# Patient Record
Sex: Female | Born: 1975 | Race: Black or African American | Hispanic: No | Marital: Married | Smoking: Current every day smoker
Health system: Southern US, Community
[De-identification: ages and names within clinical notes are randomized; demographics above are authoritative.]

## PROBLEM LIST (undated history)

## (undated) DIAGNOSIS — R12 Heartburn: Secondary | ICD-10-CM

## (undated) DIAGNOSIS — K219 Gastro-esophageal reflux disease without esophagitis: Secondary | ICD-10-CM

## (undated) DIAGNOSIS — I10 Essential (primary) hypertension: Secondary | ICD-10-CM

## (undated) HISTORY — DX: Heartburn: R12

## (undated) HISTORY — DX: Essential (primary) hypertension: I10

## (undated) HISTORY — DX: Gastro-esophageal reflux disease without esophagitis: K21.9

---

## 2003-01-16 HISTORY — PX: TUBAL LIGATION: SHX77

## 2010-06-23 DIAGNOSIS — R12 Heartburn: Secondary | ICD-10-CM

## 2010-06-23 DIAGNOSIS — I10 Essential (primary) hypertension: Secondary | ICD-10-CM

## 2010-06-23 HISTORY — DX: Heartburn: R12

## 2010-06-23 HISTORY — DX: Essential (primary) hypertension: I10

## 2010-08-02 ENCOUNTER — Encounter (INDEPENDENT_AMBULATORY_CARE_PROVIDER_SITE_OTHER): Payer: Self-pay

## 2010-09-28 ENCOUNTER — Ambulatory Visit (INDEPENDENT_AMBULATORY_CARE_PROVIDER_SITE_OTHER): Payer: Self-pay | Admitting: Internal Medicine

## 2015-11-21 ENCOUNTER — Encounter: Payer: Self-pay | Admitting: Gastroenterology

## 2015-12-14 ENCOUNTER — Telehealth: Payer: Self-pay | Admitting: Gastroenterology

## 2015-12-14 ENCOUNTER — Ambulatory Visit: Payer: Self-pay | Admitting: Gastroenterology

## 2015-12-14 ENCOUNTER — Encounter: Payer: Self-pay | Admitting: Gastroenterology

## 2015-12-14 NOTE — Telephone Encounter (Signed)
PT WAS A NO SHOW AND LETTER SENT  °

## 2018-03-26 ENCOUNTER — Encounter: Payer: Self-pay | Admitting: Gastroenterology

## 2018-06-02 ENCOUNTER — Encounter: Payer: Self-pay | Admitting: Gastroenterology

## 2018-06-02 ENCOUNTER — Telehealth: Payer: Self-pay | Admitting: Gastroenterology

## 2018-06-02 ENCOUNTER — Telehealth: Payer: Self-pay

## 2018-06-02 ENCOUNTER — Ambulatory Visit: Payer: Self-pay | Admitting: Nurse Practitioner

## 2018-06-02 ENCOUNTER — Other Ambulatory Visit: Payer: Self-pay

## 2018-06-02 NOTE — Telephone Encounter (Signed)
Tried to call pt at 9:55am [phone]) to start visit with EG, no answer, LMOVM for return call. Also tried to call [phone], fast busy signal. No return call as of 10:15am.  Misty Stanley, please no show pt.

## 2018-06-02 NOTE — Telephone Encounter (Signed)
Noted  

## 2018-06-02 NOTE — Telephone Encounter (Signed)
PATIENT WAS A NO SHOW/NO ANSWER AND LETTER SENT  °

## 2018-06-02 NOTE — Progress Notes (Signed)
Patient was a no-show 

## 2018-06-03 NOTE — Telephone Encounter (Signed)
Patient no showed and letter sent  °

## 2019-12-30 ENCOUNTER — Ambulatory Visit: Payer: Managed Care, Other (non HMO) | Admitting: Endocrinology

## 2020-02-12 ENCOUNTER — Other Ambulatory Visit: Payer: Self-pay

## 2020-02-15 ENCOUNTER — Encounter: Payer: Self-pay | Admitting: Endocrinology

## 2020-02-15 ENCOUNTER — Other Ambulatory Visit: Payer: Self-pay

## 2020-02-15 ENCOUNTER — Ambulatory Visit (INDEPENDENT_AMBULATORY_CARE_PROVIDER_SITE_OTHER): Payer: Managed Care, Other (non HMO) | Admitting: Endocrinology

## 2020-02-15 DIAGNOSIS — E0590 Thyrotoxicosis, unspecified without thyrotoxic crisis or storm: Secondary | ICD-10-CM

## 2020-02-15 LAB — TSH: TSH: <0.01 u[IU]/mL — ABNORMAL LOW (ref 0.35–4.50)

## 2020-02-15 MED ORDER — METHIMAZOLE 10 MG PO TABS: 10.0000 mg | ORAL_TABLET | Freq: Two times a day (BID) | ORAL | 11 refills | Status: DC

## 2020-02-15 NOTE — Progress Notes (Signed)
Subjective:    Patient ID: Kari Huber, female    DOB: 06-19-1975, 44 y.o.   MRN: [id number]  HPI Pt is referred by K. Holt, [place], for hyperthyroidism.  Pt reports he was dx'ed with hyperthyroidism in 2019.  she has never been on therapy for this. She started taking tapazole in late 2021.  she has never had XRT to the anterior neck, or thyroid surgery.  she has never had thyroid imaging.  she does not consume kelp or any other non-prescribed thyroid medication.  she has never been on amiodarone.  Since on Tapazole, pt states she feels no different, and well in general.  Main symptom is swelling at the ant neck.   Past Medical History:  Diagnosis Date   GERD (gastroesophageal reflux disease)    Heartburn 06/23/2010   Hypertension 06/23/2010     Social History   Socioeconomic History   Marital status: Married    Spouse name: Not on file   Number of children: Not on file   Years of education: Not on file   Highest education level: Not on file  Occupational History   Not on file  Tobacco Use   Smoking status: Not on file   Smokeless tobacco: Not on file  Substance and Sexual Activity   Alcohol use: Not on file   Drug use: Not on file   Sexual activity: Not on file  Other Topics Concern   Not on file  Social History Narrative   Not on file   Social Determinants of Health   Financial Resource Strain: Not on file  Food Insecurity: Not on file  Transportation Needs: Not on file  Physical Activity: Not on file  Stress: Not on file  Social Connections: Not on file  Intimate Partner Violence: Not on file    Current Outpatient Medications on File Prior to Visit  Medication Sig Dispense Refill   hydrochlorothiazide (,MICROZIDE/HYDRODIURIL,) 12.5 MG capsule Take 12.5 mg by mouth daily.       pantoprazole (PROTONIX) 20 MG tablet Take 20 mg by mouth daily.       No current facility-administered medications on file prior to visit.    Not on  File  Family History  Problem Relation Age of Onset   Thyroid disease Neg Hx     BP 140/90 (BP Location: Right Arm, Patient Position: Sitting, Cuff Size: Large)    Pulse 75    Ht 5\' 6"  (1.676 m)    Wt 236 lb 12.8 oz (107.4 kg)    SpO2 96%    BMI 38.22 kg/m   Review of Systems denies weight loss, palpitations, sob, excessive tremor, anxiety, and fever.  She has heat intolerance.     Objective:   Physical Exam VS: see vs page GEN: no distress HEAD: head: no deformity eyes: no periorbital swelling, no proptosis external nose and ears are normal NECK: thyroid is 5 x normal size--diffuse. CHEST WALL: no deformity LUNGS: clear to auscultation CV: reg rate and rhythm, no murmur.  MUSCULOSKELETAL: gait is normal and steady EXTEMITIES: no deformity.  no leg edema NEURO:  readily moves all 4's.  sensation is intact to touch on all 4's.  No tremor SKIN:  Normal texture and temperature.  No rash or suspicious lesion is visible.  Not diaphoretic.   NODES:  None palpable at the neck.   PSYCH: alert, well-oriented.  Does not appear anxious nor depressed.    outside test results are reviewed: TSH is undetectable  Lab Results  Component Value Date   TSH <0.01 Repeated and verified X2. (L) 02/15/2020    I have reviewed outside records, and summarized: Pt was noted to have hyperthyroidism, and referred here.  Pt presented with itching.  She was already on tapazole.      Assessment & Plan:  Hyperthyroidism, new to me, uncontrolled.  reduce methimazole to 10 mg per day.    Patient Instructions  Blood tests are requested for you today.  We'll let you know about the results.  If ever you have fever while taking methimazole, stop it and call us, even if the reason is obvious, because of the risk of a rare side-effect. It is best to never miss the medication.  However, if you do miss it, next best is to double up the next time.  let's check a thyroid "scan" (a special, but easy and painless  type of thyroid x ray).  It works like this: you go to the x-ray department of the hospital to swallow a pill, which contains a miniscule amount of radiation.  You will not notice any symptoms from this.  You will go back to the x-ray department the next day, to lie down in front of a camera.  The results of this will be sent to me.   Based on the results, i hope to order for you a treatment pill of radioactive iodine.  Although it is a larger amount of radiation, you will again notice no symptoms from this.  The pill is gone from your body in a few days (during which you should stay away from other people), but takes several months to work.  Therefore, please return here approximately 6-8 weeks after the treatment.  This treatment has been available for many years, and the only known side-effect is an underactive thyroid.  It is possible that i would eventually prescribe for you a thyroid hormone pill, which is very inexpensive.  You don't have to worry about side-effects of this thyroid hormone pill, because it is the same molecule your thyroid makes.  Please consider this, and let us know.

## 2020-02-15 NOTE — Patient Instructions (Addendum)
Blood tests are requested for you today.  We'll let you know about the results.  If ever you have fever while taking methimazole, stop it and call us, even if the reason is obvious, because of the risk of a rare side-effect. It is best to never miss the medication.  However, if you do miss it, next best is to double up the next time.  let's check a thyroid "scan" (a special, but easy and painless type of thyroid x ray).  It works like this: you go to the x-ray department of the hospital to swallow a pill, which contains a miniscule amount of radiation.  You will not notice any symptoms from this.  You will go back to the x-ray department the next day, to lie down in front of a camera.  The results of this will be sent to me.   Based on the results, i hope to order for you a treatment pill of radioactive iodine.  Although it is a larger amount of radiation, you will again notice no symptoms from this.  The pill is gone from your body in a few days (during which you should stay away from other people), but takes several months to work.  Therefore, please return here approximately 6-8 weeks after the treatment.  This treatment has been available for many years, and the only known side-effect is an underactive thyroid.  It is possible that i would eventually prescribe for you a thyroid hormone pill, which is very inexpensive.  You don't have to worry about side-effects of this thyroid hormone pill, because it is the same molecule your thyroid makes.  Please consider this, and let us know.

## 2020-02-16 LAB — T4, FREE: Free T4: 0.5 ng/dL — ABNORMAL LOW (ref 0.60–1.60)

## 2020-02-17 MED ORDER — METHIMAZOLE 10 MG PO TABS: 10.0000 mg | ORAL_TABLET | Freq: Every day | ORAL | 1 refills | Status: DC

## 2020-02-18 ENCOUNTER — Other Ambulatory Visit: Payer: Self-pay | Admitting: *Deleted

## 2020-02-18 MED ORDER — METHIMAZOLE 10 MG PO TABS: 10.0000 mg | ORAL_TABLET | Freq: Every day | ORAL | 1 refills | Status: DC

## 2021-03-23 LAB — TSH: TSH: 0.01 — AB (ref 0.41–5.90)

## 2021-05-05 ENCOUNTER — Ambulatory Visit (INDEPENDENT_AMBULATORY_CARE_PROVIDER_SITE_OTHER): Payer: No Typology Code available for payment source | Admitting: Nurse Practitioner

## 2021-05-05 ENCOUNTER — Encounter: Payer: Self-pay | Admitting: Nurse Practitioner

## 2021-05-05 VITALS — BP 141/88 | HR 67 | Ht 66.0 in | Wt 238.0 lb

## 2021-05-05 DIAGNOSIS — E0590 Thyrotoxicosis, unspecified without thyrotoxic crisis or storm: Secondary | ICD-10-CM | POA: Diagnosis not present

## 2021-05-05 NOTE — Progress Notes (Signed)
? ? ? 05/05/2021   ? ? ?Endocrinology Consult Note  ? ? ?Subjective:  ? ? Patient ID: Everette Rank, female    DOB: [birth year], PCP Royann Shivers, PA-C. ? ? ?Past Medical History:  ?Diagnosis Date  ? GERD (gastroesophageal reflux disease)   ? Heartburn 06/23/2010  ? Hypertension 06/23/2010  ? ? ?Past Surgical History:  ?Procedure Laterality Date  ? TUBAL LIGATION  2005  ? ? ?Social History  ? ?Socioeconomic History  ? Marital status: Married  ?  Spouse name: Not on file  ? Number of children: Not on file  ? Years of education: Not on file  ? Highest education level: Not on file  ?Occupational History  ? Not on file  ?Tobacco Use  ? Smoking status: Not on file  ? Smokeless tobacco: Not on file  ?Substance and Sexual Activity  ? Alcohol use: Not on file  ? Drug use: Not on file  ? Sexual activity: Not on file  ?Other Topics Concern  ? Not on file  ?Social History Narrative  ? Not on file  ? ?Social Determinants of Health  ? ?Financial Resource Strain: Not on file  ?Food Insecurity: Not on file  ?Transportation Needs: Not on file  ?Physical Activity: Not on file  ?Stress: Not on file  ?Social Connections: Not on file  ? ? ?Family History  ?Problem Relation Age of Onset  ? Thyroid disease Neg Hx   ? ? ?Outpatient Encounter Medications as of 05/05/2021  ?Medication Sig  ? amLODipine (NORVASC) 5 MG tablet Take 5 mg by mouth daily.  ? [DISCONTINUED] hydrochlorothiazide (,MICROZIDE/HYDRODIURIL,) 12.5 MG capsule Take 12.5 mg by mouth daily.    ? [DISCONTINUED] methimazole (TAPAZOLE) 10 MG tablet Take 1 tablet (10 mg total) by mouth daily.  ? [DISCONTINUED] pantoprazole (PROTONIX) 20 MG tablet Take 20 mg by mouth daily.    ? ?No facility-administered encounter medications on file as of 05/05/2021.  ? ? ?ALLERGIES: ?Allergies  ?Allergen Reactions  ? Meperidine Swelling  ?  Made her head feel like it was on fire ?Made her head feel like it was on fire ?  ? ? ?VACCINATION STATUS: ? ?There is no immunization history  on file for this patient. ? ? ?HPI ? ?Mistee Soliman is 46 y.o. female who presents today with a medical history as above. she is being seen in consultation for hyperthyroidism requested by Royann Shivers, PA-C.  she has been dealing with symptoms of  right neck mass, fatigue, heat intolerance, and headache intermittently for approximately 2 years. These symptoms are progressively worsening and troubling to her.  her most recent thyroid labs revealed suppressed tSH of < 0.005 on 03/23/21.  She has been on Methimazole in the past and followed with [facility] but was stopped just over a year ago.  The has never had any imaging of her thyroid in the past. ? ?she denies dysphagia, choking, shortness of breath, no recent voice change.  ?  ?she denies family history of thyroid dysfunction- says mom had thyroidectomy due to esophageal cancer, and denies family hx of thyroid cancer. she denies personal history of goiter. she is not on any anti-thyroid medications nor on any thyroid hormone supplements. Denies use of Biotin containing supplements.  she is willing to proceed with appropriate work up and therapy for thyrotoxicosis. ? ?She does smoke marijuana which can mask some of the symptoms of hyperthyroidism. ? ? ?Review of systems ? ?Constitutional: + Minimally fluctuating body weight, current  Body mass index is 38.41 kg/m?., + fatigue, + subjective hyperthermia, no subjective hypothermia ?Eyes: no blurry vision, no xerophthalmia ?ENT: no sore throat, + nodules palpated in throat- right side, no dysphagia/odynophagia, no hoarseness ?Cardiovascular: no chest pain, no shortness of breath, no palpitations, no leg swelling ?Respiratory: no cough, no shortness of breath ?Gastrointestinal: no nausea/vomiting/diarrhea ?Musculoskeletal: no muscle/joint aches ?Skin: no rashes, no hyperemia ?Neurological: no tremors, no numbness, no tingling, no dizziness ?Psychiatric: no depression, no anxiety ? ? ?Objective:  ?   ?BP (!) 141/88   Pulse 67   Ht 5\' 6"  (1.676 m)   Wt 238 lb (108 kg)   BMI 38.41 kg/m?   ?Wt Readings from Last 3 Encounters:  ?05/05/21 238 lb (108 kg)  ?02/15/20 236 lb 12.8 oz (107.4 kg)  ?08/02/10 219 lb (99.3 kg)  ?  ? ?BP Readings from Last 3 Encounters:  ?05/05/21 (!) 141/88  ?02/15/20 140/90  ?08/02/10 122/86  ? ? ?                     ? ?Physical Exam- Limited ? ?Constitutional:  Body mass index is 38.41 kg/m?. , not in acute distress, normal state of mind ?Eyes:  EOMI, no exophthalmos ?Neck: palpable, soft baseball sized mass to right neck (? Salivary gland) ?Thyroid: No gross goiter ?Cardiovascular: RRR, no murmurs, rubs, or gallops, no edema ?Respiratory: Adequate breathing efforts, no crackles, rales, rhonchi, or wheezing ?Musculoskeletal: no gross deformities, strength intact in all four extremities, no gross restriction of joint movements ?Skin:  no rashes, no hyperemia ?Neurological: no tremor with outstretched hands ? ? ?CMP  ?No results found for: NA, K, CL, CO2, GLUCOSE, BUN, CREATININE, CALCIUM, PROT, ALBUMIN, AST, ALT, ALKPHOS, BILITOT, GFRNONAA, GFRAA ? ? ?CBC ?No results found for: WBC, RBC, HGB, HCT, PLT, MCV, MCH, MCHC, RDW, LYMPHSABS, MONOABS, EOSABS, BASOSABS ? ? ?Diabetic Labs (most recent): ?No results found for: HGBA1C ? ?Lipid Panel  ?No results found for: CHOL, TRIG, HDL, CHOLHDL, VLDL, LDLCALC, LDLDIRECT, LABVLDL ? ? ?Lab Results  ?Component Value Date  ? TSH 0.01 (A) 03/23/2021  ? TSH <0.01 Repeated and verified X2. (L) 02/15/2020  ? FREET4 0.50 (L) 02/15/2020  ?  ? ? ? ? ?Assessment & Plan:  ? ?1. Hyperthyroidism ? ?she is being seen at a kind request of 02/17/2020. ? ?her history and most recent labs are reviewed, and she was examined clinically. Subjective and objective findings are consistent with thyrotoxicosis likely from primary hyperthyroidism. The potential risks of untreated thyrotoxicosis and the need for definitive therapy have been discussed in  detail with her, and she agrees to proceed with diagnostic workup and treatment plan. ?  ?I will repeat full profile thyroid function tests today including antibody testing to help classify her dysfunction and confirmatory thyroid uptake and scan will be scheduled to be done as soon as possible.  Will also order thyroid ultrasound to assess mass to right neck. ?  ?Options of therapy are discussed with her.  We discussed the option of treating it with medications including methimazole or PTU which may have side effects including rash, transaminitis, and bone marrow suppression.  We also discussed the option of definitive therapy with RAI ablation of the thyroid. If she is found to have primary hyperthyroidism from Graves' disease , toxic multinodular goiter or toxic nodular goiter the preferred modality of treatment would be I-131 thyroid ablation. Surgery is another choice of treatment in some cases, in her case  surgery is not a good fit for presentation with only mild goiter. ? ?-Patient is made aware of the high likelihood of post ablative hypothyroidism with subsequent need for lifelong thyroid hormone replacement. sheunderstands this outcome and she is  willing to proceed.     ? ?she will return in 3 weeks for treatment decision. ? ?I did not initiate any new prescriptions at today's visit. ? ? ? ?-Patient is advised to maintain close follow up with Royann ShiversSkillman, Katherine E, PA-C for primary care needs. ? ? ?- Time spent with the patient: 60 minutes, of which >50% was spent in obtaining information about her symptoms, reviewing her previous labs, evaluations, and treatments, counseling her about her hyperthyroidism , and developing a plan to confirm the diagnosis and long term treatment as necessary. Please refer to "Patient Self Inventory" in the Media tab for reviewed elements of pertinent patient history. ? ?Caradean Kulick participated in the discussions, expressed understanding, and voiced agreement with the  above plans.  All questions were answered to her satisfaction. she is encouraged to contact clinic should she have any questions or concerns prior to her return visit.  ? ?Follow up plan: ?Return in about 3 w

## 2021-05-05 NOTE — Patient Instructions (Signed)
Hyperthyroidism  Hyperthyroidism is when the thyroid gland is too active (overactive). The thyroid gland is a small gland located in the lower front part of the neck, just in front of the windpipe (trachea). This gland makes hormones that help control how the body uses food for energy (metabolism) as well as how the heart and brain function. These hormones also play a role in keeping your bones strong. When the thyroid is overactive, it produces too much of a hormone called thyroxine. What are the causes? This condition may be caused by: Graves' disease. This is a disorder in which the body's disease-fighting system (immune system) attacks the thyroid gland. This is the most common cause. Inflammation of the thyroid gland. A tumor in the thyroid gland. Use of certain medicines, including: Prescription thyroid hormone replacement. Herbal supplements that mimic thyroid hormones. Amiodarone therapy. Solid or fluid-filled lumps within your thyroid gland (thyroid nodules). Taking in a large amount of iodine from foods or medicines. What increases the risk? You are more likely to develop this condition if: You are female. You have a family history of thyroid conditions. You smoke tobacco. You use a medicine called lithium. You take medicines that affect the immune system (immunosuppressants). What are the signs or symptoms? Symptoms of this condition include: Nervousness. Inability to tolerate heat. Unexplained weight loss. Diarrhea. Change in the texture of hair or skin. Heart skipping beats or making extra beats. Rapid heart rate. Loss of menstruation. Shaky hands. Fatigue. Restlessness. Sleep problems. Enlarged thyroid gland or a lump in the thyroid (nodule). You may also have symptoms of Graves' disease, which may include: Protruding eyes. Dry eyes. Red or swollen eyes. Problems with vision. How is this diagnosed? This condition may be diagnosed based on: Your symptoms and  medical history. A physical exam. Blood tests. Thyroid ultrasound. This test involves using sound waves to produce images of the thyroid gland. A thyroid scan. A radioactive substance is injected into a vein, and images show how much iodine is present in the thyroid. Radioactive iodine uptake test (RAIU). A small amount of radioactive iodine is given by mouth to see how much iodine the thyroid absorbs after a certain amount of time. How is this treated? Treatment depends on the cause and severity of the condition. Treatment may include: Medicines to reduce the amount of thyroid hormone your body makes. Radioactive iodine treatment (radioiodine therapy). This involves swallowing a small dose of radioactive iodine, in capsule or liquid form, to kill thyroid cells. Surgery to remove part or all of your thyroid gland. You may need to take thyroid hormone replacement medicine for the rest of your life after thyroid surgery. Medicines to help manage your symptoms. Follow these instructions at home:  Take over-the-counter and prescription medicines only as told by your health care provider. Do not use any products that contain nicotine or tobacco, such as cigarettes and e-cigarettes. If you need help quitting, ask your health care provider. Follow any instructions from your health care provider about diet. You may be instructed to limit foods that contain iodine. Keep all follow-up visits as told by your health care provider. This is important. You will need to have blood tests regularly so that your health care provider can monitor your condition. Contact a health care provider if: Your symptoms do not get better with treatment. You have a fever. You are taking thyroid hormone replacement medicine and you: Have symptoms of depression. Feel like you are tired all the time. Gain weight. Get help   right away if: You have chest pain. You have decreased alertness or a change in your awareness. You  have abdominal pain. You feel dizzy. You have a rapid heartbeat. You have an irregular heartbeat. You have difficulty breathing. Summary The thyroid gland is a small gland located in the lower front part of the neck, just in front of the windpipe (trachea). Hyperthyroidism is when the thyroid gland is too active (overactive) and produces too much of a hormone called thyroxine. The most common cause is Graves' disease, a disorder in which your immune system attacks the thyroid gland. Hyperthyroidism can cause various symptoms, such as unexplained weight loss, nervousness, inability to tolerate heat, or changes in your heartbeat. Treatment may include medicine to reduce the amount of thyroid hormone your body makes, radioiodine therapy, surgery, or medicines to manage symptoms. This information is not intended to replace advice given to you by your health care provider. Make sure you discuss any questions you have with your health care provider. Document Revised: 01/13/2021 Document Reviewed: 09/17/2019 Elsevier Patient Education  2023 [employer] Inc[employer]  

## 2021-05-06 LAB — T4, FREE: Free T4: 2.45 ng/dL — ABNORMAL HIGH (ref 0.82–1.77)

## 2021-05-06 LAB — THYROGLOBULIN ANTIBODY: Thyroglobulin Antibody: <1 IU/mL (ref 0.0–0.9)

## 2021-05-06 LAB — THYROID PEROXIDASE ANTIBODY: Thyroperoxidase Ab SerPl-aCnc: 62 IU/mL — ABNORMAL HIGH (ref 0–34)

## 2021-05-06 LAB — T3, FREE: T3, Free: 5.9 pg/mL — ABNORMAL HIGH (ref 2.0–4.4)

## 2021-05-06 LAB — TSH: TSH: <0.005 u[IU]/mL — ABNORMAL LOW (ref 0.450–4.500)

## 2021-05-08 ENCOUNTER — Telehealth: Payer: Self-pay

## 2021-05-08 ENCOUNTER — Encounter: Payer: Self-pay | Admitting: Nurse Practitioner

## 2021-05-08 NOTE — Telephone Encounter (Signed)
Pt left a msg that she should be available at 2:00 today. She said she received a call from [place] ?

## 2021-05-08 NOTE — Telephone Encounter (Signed)
Called and left a detailed voice message for the patient to call back to discuss lab results per Whitney: ? ?Her thyroid labs are consistent with overactive thyroid.  Her antibody testing was positive, indicating she has autoimmune thyroid disease or Graves disease which we will talk more about at her follow up appointment.  Please ensure she gets scheduled for the uptake and scan asap.  If they do not have availability to have it done in the next week or so, we may need to put her on Methimazole temporarily to help slow her thyroid down in the meantime.  I would like to try and avoid this if possible as it would only delay her definitive treatment.  ?

## 2021-05-08 NOTE — Telephone Encounter (Signed)
Pt requesting your call back. ?

## 2021-05-08 NOTE — Progress Notes (Signed)
Her thyroid labs are consistent with overactive thyroid.  Her antibody testing was positive, indicating she has autoimmune thyroid disease or Graves disease which we will talk more about at her follow up appointment.  Please ensure she gets scheduled for the uptake and scan asap.  If they do not have availability to have it done in the next week or so, we may need to put her on Methimazole temporarily to help slow her thyroid down in the meantime.  I would like to try and avoid this if possible as it would only delay her definitive treatment.

## 2021-05-08 NOTE — Telephone Encounter (Signed)
I called the patient back and gave her the message from [place]. Patient verbalized an understanding. ?

## 2021-05-08 NOTE — Progress Notes (Signed)
Her thyroid labs are consistent with overactive thyroid.  Her antibody testing was positive, indicating she has autoimmune thyroid disease or Graves disease which we will talk more about at her follow up appointment.  Please ensure she gets scheduled for the uptake and scan asap.  If they do not have availability to have it done in the next week or so, we may need to put her on Methimazole temporarily to help slow her thyroid down in the meantime.  I would like to try and avoid this if possible as it would only delay her definitive treatment.

## 2021-05-18 ENCOUNTER — Ambulatory Visit (HOSPITAL_COMMUNITY)
Admission: RE | Admit: 2021-05-18 | Discharge: 2021-05-18 | Disposition: A | Discharge status: Home / Self Care | Payer: PRIVATE HEALTH INSURANCE | Source: Ambulatory Visit | Attending: Nurse Practitioner | Admitting: Nurse Practitioner

## 2021-05-18 ENCOUNTER — Encounter (HOSPITAL_COMMUNITY)
Admission: RE | Admit: 2021-05-18 | Discharge: 2021-05-18 | Disposition: A | Discharge status: Home / Self Care | Payer: PRIVATE HEALTH INSURANCE | Source: Ambulatory Visit | Attending: Nurse Practitioner | Admitting: Nurse Practitioner

## 2021-05-18 DIAGNOSIS — E0590 Thyrotoxicosis, unspecified without thyrotoxic crisis or storm: Secondary | ICD-10-CM | POA: Diagnosis not present

## 2021-05-18 MED ORDER — SODIUM IODIDE I-123 7.4 MBQ CAPS
457.0000 | ORAL_CAPSULE | Freq: Once | ORAL | Status: AC
  Administered 2021-05-18: 457 via ORAL

## 2021-05-19 ENCOUNTER — Telehealth: Payer: Self-pay

## 2021-05-19 ENCOUNTER — Encounter (HOSPITAL_COMMUNITY): Payer: Self-pay

## 2021-05-19 ENCOUNTER — Encounter (HOSPITAL_COMMUNITY)
Admission: RE | Admit: 2021-05-19 | Discharge: 2021-05-19 | Disposition: A | Discharge status: Home / Self Care | Payer: PRIVATE HEALTH INSURANCE | Source: Ambulatory Visit | Attending: Nurse Practitioner | Admitting: Nurse Practitioner

## 2021-05-19 DIAGNOSIS — E0590 Thyrotoxicosis, unspecified without thyrotoxic crisis or storm: Secondary | ICD-10-CM

## 2021-05-19 NOTE — Telephone Encounter (Signed)
Spoke with pt and she agrees to have RAI ablation done. Will schedule 6 wks out once pt gets an appt with [facility]. ?

## 2021-05-19 NOTE — Telephone Encounter (Signed)
-----   Message from Brita Romp, NP sent at 05/19/2021 10:52 AM EDT ----- ?Can you call patient and let her know that I got the results back from her uptake and scan showing she does have Graves disease (hyperthyroidism).  We talked briefly about what RAI ablation was at her initial consultation, which is what we need to schedule now.  This will eventually stop her thyroid from functioning altogether but it is imperative we do it soon to prevent unwanted complications.  I will go ahead and order it today.  We will need to recheck thyroid function tests  (TSH, FT3, FT4) about 6 weeks after the procedure to assess response to treatment.  Her ultrasound will be discussed at her follow up in detail, nothing which warrants any action. ?

## 2021-05-19 NOTE — Progress Notes (Signed)
Can you call patient and let her know that I got the results back from her uptake and scan showing she does have Graves disease (hyperthyroidism).  We talked briefly about what RAI ablation was at her initial consultation, which is what we need to schedule now.  This will eventually stop her thyroid from functioning altogether but it is imperative we do it soon to prevent unwanted complications.  I will go ahead and order it today.  We will need to recheck thyroid function tests  (TSH, FT3, FT4) about 6 weeks after the procedure to assess response to treatment.  Her ultrasound will be discussed at her follow up in detail, nothing which warrants any action.

## 2021-05-23 NOTE — Written Directive (Addendum)
MOLECULAR IMAGING AND THERAPEUTICS WRITTEN DIRECTIVE ? ? ?PATIENT NAME: Kari Huber ? ?PT DOB:   1975-09-10 ?                                             ?MRN: [id number] ? ?--------------------------------------------------------------------------------------------------------------------- ? ? ?I-131 WHOLE THYROID THERAPY (NON-CANCER) ? ? ? ?RADIOPHARMACEUTICAL:   Iodine-131 Capsule  ? ? ?PRESCRIBED DOSE FOR ADMINISTRATION: 20 mCi  (twenty milliCuries) ? ? ?ROUTE OFADMINISTRATION: PO ? ? ?DIAGNOSIS:  Graves disease ? ? ?REFERRING PHYSICIAN: Brita Romp, NP ? ? ?TSH:    ?Lab Results  ?Component Value Date  ? TSH <0.005 (L) 05/05/2021  ? TSH 0.01 (A) 03/23/2021  ? TSH <0.01 Repeated and verified X2. (L) 02/15/2020  ? ? ? ?PRIOR I-131 THERAPY (Date and Dose): NA ? ? ?PRIOR RADIOLOGY EXAMS (Results and Date): ?NM THYROID MULT UPTAKE W/IMAGING ? ?Result Date: 05/19/2021 ?CLINICAL DATA:  Hyperthyroidism, heat intolerance, neck swelling, difficulty swallowing, diarrhea, sore throat, fatigue, increased perspiration, suppressed TSH EXAM: THYROID SCAN AND UPTAKE - 4 AND 24 HOURS TECHNIQUE: Following oral administration of I-123 capsule, anterior planar imaging was acquired at 24 hours. Thyroid uptake was calculated with a thyroid probe at 4-6 hours and 24 hours. RADIOPHARMACEUTICALS:  457 uCi I-123 sodium iodide p.o. COMPARISON:  None Available. FINDINGS: Homogeneous tracer distribution in both thyroid lobes. No focal areas of increased or decreased tracer localization. 4 hour I-123 uptake = 35.6% (normal 5-20%) 24 hour I-123 uptake = 46.8% (normal 10-30%) IMPRESSION: Homogeneous thyroid scan with elevated 4 hour and 24 hour radio iodine uptakes. Findings consistent with Graves disease. Electronically Signed   By: Lavonia Dana M.D.   On: 05/19/2021 10:45  ? ?US THYROID ? ?Result Date: 05/18/2021 ?CLINICAL DATA:  Hyperthyroid. EXAM: THYROID ULTRASOUND TECHNIQUE: Ultrasound examination of the thyroid gland and adjacent  soft tissues was performed. COMPARISON:  None Available. FINDINGS: Parenchymal Echotexture: Moderately heterogeneous, no significantly increased vascularity Isthmus: 0.4 cm Right lobe: 6.0 x 2.0 x 2.4 cm Left lobe: 5.5 x 1.7 x 2.8 cm ________________________________________________________ Estimated total number of nodules >/= 1 cm: 1 Number of spongiform nodules >/=  2 cm not described below (TR1): 0 Number of mixed cystic and solid nodules >/= 1.5 cm not described below ([place]): 0 _________________________________________________________ Nodule # 1: Location: Isthmus; Inferior Maximum size: 0.8 cm; Other 2 dimensions: 0.4 x 0.8 cm Composition: solid/almost completely solid (2) Echogenicity: hypoechoic (2) Shape: not taller-than-wide (0) Margins: smooth (0) Echogenic foci: none (0) ACR TI-RADS total points: 4. ACR TI-RADS risk category: TR4 (4-6 points). ACR TI-RADS recommendations: Given size (<0.9 cm) and appearance, this nodule does NOT meet TI-RADS criteria for biopsy or dedicated follow-up. _________________________________________________________ Nodule # 2: Location: Left; Mid Maximum size: 1.1 cm; Other 2 dimensions: 0.8 x 0.4 cm Composition: solid/almost completely solid (2) Echogenicity: hypoechoic (2) Shape: not taller-than-wide (0) Margins: ill-defined (0) Echogenic foci: none (0) ACR TI-RADS total points: 4. ACR TI-RADS risk category: TR4 (4-6 points). ACR TI-RADS recommendations: *Given size (>/= 1 - 1.4 cm) and appearance, a follow-up ultrasound in 1 year should be considered based on TI-RADS criteria. _________________________________________________________ No cervical lymphadenopathy. IMPRESSION: 1. Mildly enlarged and moderately heterogeneous thyroid gland. These are nonspecific findings but could be seen with thyroiditis or autoimmune thyroid disorder. 2. Solid nodule in the left inferior thyroid (labeled 2, 1.1 cm) meets criteria (TI-RADS category 4) for 1 year  ultrasound surveillance. The above  is in keeping with the ACR TI-RADS recommendations - J Am Coll Radiol 2017;14:587-595. Ruthann Cancer, MD [facility] Electronically Signed   By: Ruthann Cancer M.D.   On: 05/18/2021 10:16    ? ? ?ADDITIONAL PHYSICIAN COMMENTS/NOTES ? ? ?AUTHORIZED USER SIGNATURE & TIME STAMP: ?

## 2021-05-26 ENCOUNTER — Ambulatory Visit (HOSPITAL_COMMUNITY)
Admission: RE | Admit: 2021-05-26 | Discharge: 2021-05-26 | Disposition: A | Discharge status: Home / Self Care | Payer: PRIVATE HEALTH INSURANCE | Source: Ambulatory Visit | Attending: Nurse Practitioner | Admitting: Nurse Practitioner

## 2021-05-26 ENCOUNTER — Encounter (HOSPITAL_COMMUNITY): Payer: Self-pay

## 2021-05-26 ENCOUNTER — Ambulatory Visit: Payer: No Typology Code available for payment source | Admitting: Nurse Practitioner

## 2021-05-26 DIAGNOSIS — E0590 Thyrotoxicosis, unspecified without thyrotoxic crisis or storm: Secondary | ICD-10-CM | POA: Diagnosis present

## 2021-05-26 MED ORDER — SODIUM IODIDE I 131 CAPSULE
20.0000 | Freq: Once | INTRAVENOUS | Status: AC | PRN
  Administered 2021-05-26: 20.7 via ORAL

## 2021-07-07 ENCOUNTER — Ambulatory Visit: Payer: No Typology Code available for payment source | Admitting: Nurse Practitioner

## 2021-07-08 LAB — T3, FREE: T3, Free: 4.8 pg/mL — ABNORMAL HIGH (ref 2.0–4.4)

## 2021-07-08 LAB — T4, FREE: Free T4: 2.62 ng/dL — ABNORMAL HIGH (ref 0.82–1.77)

## 2021-07-08 LAB — TSH: TSH: <0.005 u[IU]/mL — ABNORMAL LOW (ref 0.450–4.500)

## 2021-07-14 ENCOUNTER — Ambulatory Visit (INDEPENDENT_AMBULATORY_CARE_PROVIDER_SITE_OTHER): Payer: No Typology Code available for payment source | Admitting: Nurse Practitioner

## 2021-07-14 ENCOUNTER — Encounter: Payer: Self-pay | Admitting: Nurse Practitioner

## 2021-07-14 VITALS — BP 138/83 | HR 79 | Ht 66.0 in | Wt 236.0 lb

## 2021-07-14 DIAGNOSIS — E0590 Thyrotoxicosis, unspecified without thyrotoxic crisis or storm: Secondary | ICD-10-CM | POA: Diagnosis not present

## 2021-07-14 DIAGNOSIS — E890 Postprocedural hypothyroidism: Secondary | ICD-10-CM | POA: Diagnosis not present

## 2021-07-14 NOTE — Progress Notes (Signed)
07/14/2021     Endocrinology Follow Up Note    Subjective:    Patient ID: Kari Huber, female    DOB: Nov 16, 1975, PCP Royann Shivers, PA-C.   Past Medical History:  Diagnosis Date   GERD (gastroesophageal reflux disease)    Heartburn 06/23/2010   Hypertension 06/23/2010    Past Surgical History:  Procedure Laterality Date   TUBAL LIGATION  2005    Social History   Socioeconomic History   Marital status: Married    Spouse name: Not on file   Number of children: Not on file   Years of education: Not on file   Highest education level: Not on file  Occupational History   Not on file  Tobacco Use   Smoking status: Every Day    Packs/day: 1.00    Types: Cigarettes   Smokeless tobacco: Not on file  Vaping Use   Vaping Use: Never used  Substance and Sexual Activity   Alcohol use: Never   Drug use: Yes    Types: Marijuana   Sexual activity: Not on file  Other Topics Concern   Not on file  Social History Narrative   Not on file   Social Determinants of Health   Financial Resource Strain: Not on file  Food Insecurity: Not on file  Transportation Needs: Not on file  Physical Activity: Not on file  Stress: Not on file  Social Connections: Not on file    Family History  Problem Relation Age of Onset   Thyroid disease Neg Hx     Outpatient Encounter Medications as of 07/14/2021  Medication Sig   amLODipine (NORVASC) 5 MG tablet Take 5 mg by mouth daily.   No facility-administered encounter medications on file as of 07/14/2021.    ALLERGIES: Allergies  Allergen Reactions   Meperidine Swelling    Made her head feel like it was on fire Made her head feel like it was on fire     VACCINATION STATUS:  There is no immunization history on file for this patient.   HPI  Kari Huber is 46 y.o. female who presents today with a medical history as above. she is being seen in follow up after being seen in consultation for  hyperthyroidism requested by Royann Shivers, PA-C.  she has been dealing with symptoms of  right neck mass, fatigue, heat intolerance, and headache intermittently for approximately 2 years. These symptoms are progressively worsening and troubling to her.  her most recent thyroid labs revealed suppressed tSH of < 0.005 on 03/23/21.  She has been on Methimazole in the past and followed with [facility] but was stopped just over a year ago.  The has never had any imaging of her thyroid in the past.  she denies dysphagia, choking, shortness of breath, no recent voice change.    she denies family history of thyroid dysfunction- says mom had thyroidectomy due to esophageal cancer, and denies family hx of thyroid cancer. she denies personal history of goiter. she is not on any anti-thyroid medications nor on any thyroid hormone supplements. Denies use of Biotin containing supplements.  she is willing to proceed with appropriate work up and therapy for thyrotoxicosis.  She does smoke marijuana which can mask some of the symptoms of hyperthyroidism.   Review of systems  Constitutional: + Minimally fluctuating body weight, current Body mass index is 38.09 kg/m., + fatigue, + subjective hyperthermia, no subjective hypothermia Eyes: no blurry vision, no xerophthalmia ENT: no sore  throat, + nodules palpated in throat- right side, no dysphagia/odynophagia, no hoarseness Cardiovascular: no chest pain, no shortness of breath, no palpitations, no leg swelling Respiratory: no cough, no shortness of breath Gastrointestinal: no nausea/vomiting/diarrhea Musculoskeletal: no muscle/joint aches Skin: no rashes, no hyperemia Neurological: no tremors, no numbness, no tingling, no dizziness Psychiatric: no depression, no anxiety   Objective:    BP 138/83   Pulse 79   Ht 5\' 6"  (1.676 m)   Wt 236 lb (107 kg)   BMI 38.09 kg/m   Wt Readings from Last 3 Encounters:  07/14/21 236 lb (107 kg)   05/05/21 238 lb (108 kg)  02/15/20 236 lb 12.8 oz (107.4 kg)     BP Readings from Last 3 Encounters:  07/14/21 138/83  05/05/21 (!) 141/88  02/15/20 140/90                          Physical Exam- Limited  Constitutional:  Body mass index is 38.09 kg/m. , not in acute distress, normal state of mind Eyes:  EOMI, no exophthalmos Neck: palpable, soft baseball sized mass to right neck (? Salivary gland) Thyroid: No gross goiter Cardiovascular: RRR, no murmurs, rubs, or gallops, no edema Respiratory: Adequate breathing efforts, no crackles, rales, rhonchi, or wheezing Musculoskeletal: no gross deformities, strength intact in all four extremities, no gross restriction of joint movements Skin:  no rashes, no hyperemia Neurological: no tremor with outstretched hands   CMP  No results found for: "NA", "K", "CL", "CO2", "GLUCOSE", "BUN", "CREATININE", "CALCIUM", "PROT", "ALBUMIN", "AST", "ALT", "ALKPHOS", "BILITOT", "GFRNONAA", "GFRAA"   CBC No results found for: "WBC", "RBC", "HGB", "HCT", "PLT", "MCV", "MCH", "MCHC", "RDW", "LYMPHSABS", "MONOABS", "EOSABS", "BASOSABS"   Diabetic Labs (most recent): No results found for: "HGBA1C", "MICROALBUR"  Lipid Panel  No results found for: "CHOL", "TRIG", "HDL", "CHOLHDL", "VLDL", "LDLCALC", "LDLDIRECT", "LABVLDL"   Lab Results  Component Value Date   TSH <0.005 (L) 07/07/2021   TSH <0.005 (L) 05/05/2021   TSH 0.01 (A) 03/23/2021   TSH <0.01 Repeated and verified X2. (L) 02/15/2020   FREET4 2.62 (H) 07/07/2021   FREET4 2.45 (H) 05/05/2021   FREET4 0.50 (L) 02/15/2020    Thyroid 02/17/2020 from 05/18/21 CLINICAL DATA:  Hyperthyroid.   EXAM: THYROID ULTRASOUND   TECHNIQUE: Ultrasound examination of the thyroid gland and adjacent soft tissues was performed.   COMPARISON:  None Available.   FINDINGS: Parenchymal Echotexture: Moderately heterogeneous, no significantly increased vascularity   Isthmus: 0.4 cm   Right lobe: 6.0 x 2.0  x 2.4 cm   Left lobe: 5.5 x 1.7 x 2.8 cm   ________________________________________________________   Estimated total number of nodules >/= 1 cm: 1   Number of spongiform nodules >/=  2 cm not described below (TR1): 0   Number of mixed cystic and solid nodules >/= 1.5 cm not described below (TR2): 0   _________________________________________________________   Nodule # 1:   Location: Isthmus; Inferior   Maximum size: 0.8 cm; Other 2 dimensions: 0.4 x 0.8 cm   Composition: solid/almost completely solid (2)   Echogenicity: hypoechoic (2)   Shape: not taller-than-wide (0)   Margins: smooth (0)   Echogenic foci: none (0)   ACR TI-RADS total points: 4.   ACR TI-RADS risk category: TR4 (4-6 points).   ACR TI-RADS recommendations:   Given size (<0.9 cm) and appearance, this nodule does NOT meet TI-RADS criteria for biopsy or dedicated follow-up.   _________________________________________________________   Nodule #  2:   Location: Left; Mid   Maximum size: 1.1 cm; Other 2 dimensions: 0.8 x 0.4 cm   Composition: solid/almost completely solid (2)   Echogenicity: hypoechoic (2)   Shape: not taller-than-wide (0)   Margins: ill-defined (0)   Echogenic foci: none (0)   ACR TI-RADS total points: 4.   ACR TI-RADS risk category: TR4 (4-6 points).   ACR TI-RADS recommendations:   *Given size (>/= 1 - 1.4 cm) and appearance, a follow-up ultrasound in 1 year should be considered based on TI-RADS criteria.   _________________________________________________________   No cervical lymphadenopathy.   IMPRESSION: 1. Mildly enlarged and moderately heterogeneous thyroid gland. These are nonspecific findings but could be seen with thyroiditis or autoimmune thyroid disorder. 2. Solid nodule in the left inferior thyroid (labeled 2, 1.1 cm) meets criteria (TI-RADS category 4) for 1 year ultrasound surveillance.   The above is in keeping with the ACR TI-RADS  recommendations - J Am Coll Radiol 2017;14:587-595.   Marliss Coots, MD   [facility]   [facility]     Electronically Signed   By: Marliss Coots M.D.   On: 05/18/2021 10:16 --------------------------------------------------------------------------------------------------------------------- Uptake and Scan from 05/19/21 CLINICAL DATA:  Hyperthyroidism, heat intolerance, neck swelling, difficulty swallowing, diarrhea, sore throat, fatigue, increased perspiration, suppressed TSH   EXAM: THYROID SCAN AND UPTAKE - 4 AND 24 HOURS   TECHNIQUE: Following oral administration of I-123 capsule, anterior planar imaging was acquired at 24 hours. Thyroid uptake was calculated with a thyroid probe at 4-6 hours and 24 hours.   RADIOPHARMACEUTICALS:  457 uCi I-123 sodium iodide p.o.   COMPARISON:  None Available.   FINDINGS: Homogeneous tracer distribution in both thyroid lobes.   No focal areas of increased or decreased tracer localization.   4 hour I-123 uptake = 35.6% (normal 5-20%)   24 hour I-123 uptake = 46.8% (normal 10-30%)   IMPRESSION: Homogeneous thyroid scan with elevated 4 hour and 24 hour radio iodine uptakes.   Findings consistent with Graves disease.     Electronically Signed   By: Ulyses Southward M.D.   On: 05/19/2021 10:45  Assessment & Plan:   1. 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 MNG  she is being seen at a kind request of Royann Shivers, [place].  Her 4 and 24-hr uptake was high, consistent with Graves disease.  I discussed and ordered RAI ablation which was done on 05/26/21.   Her repeat thyroid labs are still suggestive of hyperthyroidism.  Will repeat thyroid labs in 2 months to assess response to treatment and for timing of initiation of thyroid hormone replacement therapy.  The thyroid ultrasound shows nodules that warrant follow up with [place] in 1 year but did not mention the soft mass on her right  neck.    -Patient is advised to maintain close follow up with Royann Shivers, PA-C for primary care needs.    I spent 30 minutes in the care of the patient today including review of labs from Thyroid Function, CMP, and other relevant labs ; imaging/biopsy records (current and previous including abstractions from other facilities); face-to-face time discussing  her lab results and symptoms, medications doses, her options of short and long term treatment based on the latest standards of care / guidelines;   and documenting the encounter.  Morris Tremblay  participated in the discussions, expressed understanding, and voiced agreement with the above plans.  All questions were answered to her satisfaction. she is encouraged to contact clinic  should she have any questions or concerns prior to her return visit.  Follow up plan: Return in about 2 months (around 09/13/2021) for Thyroid follow up, Previsit labs.   Thank you for involving me in the care of this pleasant patient, and I will continue to update you with her progress.    Ronny Bacon, [facility] [address] [place], [place] [address] Phone: [phone] Fax: [phone]  07/14/2021, 10:37 AM

## 2021-09-15 ENCOUNTER — Ambulatory Visit: Payer: No Typology Code available for payment source | Admitting: Nurse Practitioner

## 2021-09-22 ENCOUNTER — Ambulatory Visit: Payer: No Typology Code available for payment source | Admitting: Nurse Practitioner

## 2021-10-23 ENCOUNTER — Encounter: Payer: Self-pay | Admitting: Nurse Practitioner

## 2021-10-25 ENCOUNTER — Telehealth: Payer: Self-pay | Admitting: *Deleted

## 2021-10-25 MED ORDER — LEVOTHYROXINE SODIUM 50 MCG PO TABS: 50.0000 ug | ORAL_TABLET | Freq: Every day | ORAL | 0 refills | Status: DC

## 2021-10-25 NOTE — Telephone Encounter (Signed)
-----   Message from Brita Romp, NP sent at 10/25/2021  8:05 AM EDT ----- Lynelle Smoke, please call patient and let her know her recent labs show her thyroid has responded nicely to the RAI ablation.  She is now ready to start thyroid hormone replacement.  I will send in a prescription for Levothyroxine 50 mcg po daily before bre akfast to her pharmacy on file.  Then we will need to check labs again in 2 months to see if we need further adjustments.  Make sure she knows to take her thyroid hormone by itself on an empty stomach in the morning, waiting at least 30 minutes to an  hour before eating/drinking or taking other medications.  If she is on iron supplement, acid reflux meds, calcium supplements, multivitamins, those will need to wait 4 hours to maximize absorption of all meds.  [place], can we schedule a follow up for her in 2 months?

## 2021-10-25 NOTE — Addendum Note (Signed)
Addended by: Brita Romp on: 10/25/2021 08:05 AM   Modules accepted: Orders

## 2021-10-25 NOTE — Progress Notes (Signed)
I have called the patient and left a message asking that he call the office back for results.

## 2021-10-25 NOTE — Telephone Encounter (Signed)
-----   Message from Vernona Rieger sent at 10/25/2021  8:12 AM EDT ----- Evett Kassa let me know after you talk with patient and I will call her to schedule a f/u  Thank you ----- Message ----- From: Brita Romp, NP Sent: 10/25/2021   8:05 AM EDT To: Grayland Ormond, LPN; Sibyl Parr, please call patient and let her know her recent labs show her thyroid has responded nicely to the RAI ablation.  She is now ready to start thyroid hormone replacement.  I will send in a prescription for Levothyroxine 50 mcg po daily before bre akfast to her pharmacy on file.  Then we will need to check labs again in 2 months to see if we need further adjustments.  Make sure she knows to take her thyroid hormone by itself on an empty stomach in the morning, waiting at least 30 minutes to an  hour before eating/drinking or taking other medications.  If she is on iron supplement, acid reflux meds, calcium supplements, multivitamins, those will need to wait 4 hours to maximize absorption of all meds.  [place], can we schedule a follow up for her in 2 months?

## 2021-10-25 NOTE — Telephone Encounter (Signed)
-----   Message from Whitney J Reardon, NP sent at 10/25/2021  8:05 AM EDT ----- Kari Huber, please call patient and let her know her recent labs show her thyroid has responded nicely to the RAI ablation.  She is now ready to start thyroid hormone replacement.  I will send in a prescription for Levothyroxine 50 mcg po daily before bre akfast to her pharmacy on file.  Then we will need to check labs again in 2 months to see if we need further adjustments.  Make sure she knows to take her thyroid hormone by itself on an empty stomach in the morning, waiting at least 30 minutes to an  hour before eating/drinking or taking other medications.  If she is on iron supplement, acid reflux meds, calcium supplements, multivitamins, those will need to wait 4 hours to maximize absorption of all meds.  [place], can we schedule a follow up for her in 2 months? 

## 2021-10-25 NOTE — Progress Notes (Signed)
I have called and left a message for the patient to call office for results.

## 2021-10-25 NOTE — Progress Notes (Signed)
Kari Huber, please call patient and let her know her recent labs show her thyroid has responded nicely to the RAI ablation.  She is now ready to start thyroid hormone replacement.  I will send in a prescription for Levothyroxine 50 mcg po daily before breakfast to her pharmacy on file.  Then we will need to check labs again in 2 months to see if we need further adjustments.  Make sure she knows to take her thyroid hormone by itself on an empty stomach in the morning, waiting at least 30 minutes to an hour before eating/drinking or taking other medications.  If she is on iron supplement, acid reflux meds, calcium supplements, multivitamins, those will need to wait 4 hours to maximize absorption of all meds.  Kari Huber, can we schedule a follow up for her in 2 months?

## 2021-10-25 NOTE — Telephone Encounter (Signed)
-----   Message from Brittany L Taylor sent at 10/25/2021  8:12 AM EDT ----- Kari Huber let me know after you talk with patient and I will call her to schedule a f/u  Thank you ----- Message ----- From: Reardon, Whitney J, NP Sent: 10/25/2021   8:05 AM EDT To: Kari Chevalier M [name], LPN; Brittany L Taylor  Kari Huber, please call patient and let her know her recent labs show her thyroid has responded nicely to the RAI ablation.  She is now ready to start thyroid hormone replacement.  I will send in a prescription for Levothyroxine 50 mcg po daily before bre akfast to her pharmacy on file.  Then we will need to check labs again in 2 months to see if we need further adjustments.  Make sure she knows to take her thyroid hormone by itself on an empty stomach in the morning, waiting at least 30 minutes to an  hour before eating/drinking or taking other medications.  If she is on iron supplement, acid reflux meds, calcium supplements, multivitamins, those will need to wait 4 hours to maximize absorption of all meds.  Britt[place], can we schedule a follow up for her in 2 months?  

## 2021-10-25 NOTE — Telephone Encounter (Signed)
A message has been left , asking the patient to call the office for results.

## 2021-10-25 NOTE — Telephone Encounter (Signed)
I have talked with the patient, giving her the results per Rayetta Pigg, NP.

## 2021-10-25 NOTE — Telephone Encounter (Signed)
A message has been left for the patient to call the office for results.

## 2021-10-25 NOTE — Telephone Encounter (Signed)
I have talked with the patient , giving her , her results per Rayetta Pigg, NP.

## 2021-10-27 LAB — TSH: TSH: 59.8 u[IU]/mL — ABNORMAL HIGH (ref 0.450–4.500)

## 2021-10-27 LAB — T3, FREE: T3, Free: <0.3 pg/mL — ABNORMAL LOW (ref 2.0–4.4)

## 2021-10-27 LAB — T4, FREE: Free T4: 0.1 ng/dL — ABNORMAL LOW (ref 0.82–1.77)

## 2021-12-25 ENCOUNTER — Ambulatory Visit: Payer: No Typology Code available for payment source | Admitting: Nurse Practitioner

## 2022-01-01 NOTE — Patient Instructions (Incomplete)

## 2022-01-02 ENCOUNTER — Encounter: Payer: Self-pay | Admitting: Nurse Practitioner

## 2022-01-03 ENCOUNTER — Ambulatory Visit: Payer: No Typology Code available for payment source | Admitting: Nurse Practitioner

## 2022-01-03 DIAGNOSIS — E890 Postprocedural hypothyroidism: Secondary | ICD-10-CM

## 2022-01-03 LAB — T4, FREE: Free T4: 0.67 ng/dL — ABNORMAL LOW (ref 0.82–1.77)

## 2022-01-03 LAB — TSH: TSH: 53.2 u[IU]/mL — ABNORMAL HIGH (ref 0.450–4.500)

## 2022-01-13 ENCOUNTER — Other Ambulatory Visit: Payer: Self-pay | Admitting: Nurse Practitioner

## 2022-01-13 DIAGNOSIS — E890 Postprocedural hypothyroidism: Secondary | ICD-10-CM

## 2022-01-16 MED ORDER — LEVOTHYROXINE SODIUM 88 MCG PO TABS: 88.0000 ug | ORAL_TABLET | Freq: Every day | ORAL | 0 refills | Status: DC

## 2022-01-16 NOTE — Telephone Encounter (Signed)
Her recent thyroid labs suggest she needs a higher dose of her thyroid pill.  She missed her last appt with me.  I have increased her dose of Levothyroxine to 88 mcg.

## 2022-01-20 ENCOUNTER — Other Ambulatory Visit: Payer: Self-pay | Admitting: Nurse Practitioner

## 2022-01-20 DIAGNOSIS — E890 Postprocedural hypothyroidism: Secondary | ICD-10-CM

## 2022-04-16 ENCOUNTER — Other Ambulatory Visit: Payer: Self-pay | Admitting: Nurse Practitioner

## 2022-04-16 DIAGNOSIS — E890 Postprocedural hypothyroidism: Secondary | ICD-10-CM

## 2022-04-17 ENCOUNTER — Other Ambulatory Visit: Payer: Self-pay | Admitting: Nurse Practitioner

## 2022-04-17 DIAGNOSIS — E890 Postprocedural hypothyroidism: Secondary | ICD-10-CM

## 2022-04-23 ENCOUNTER — Telehealth: Payer: Self-pay | Admitting: Nurse Practitioner

## 2022-04-23 DIAGNOSIS — E890 Postprocedural hypothyroidism: Secondary | ICD-10-CM

## 2022-04-23 NOTE — Telephone Encounter (Signed)
done

## 2022-04-23 NOTE — Telephone Encounter (Signed)
Can you add labs?

## 2022-04-24 ENCOUNTER — Other Ambulatory Visit: Payer: Self-pay | Admitting: Nurse Practitioner

## 2022-04-24 DIAGNOSIS — E890 Postprocedural hypothyroidism: Secondary | ICD-10-CM

## 2022-04-25 LAB — T4, FREE: Free T4: 1 ng/dL (ref 0.82–1.77)

## 2022-04-25 LAB — TSH: TSH: 21.1 u[IU]/mL — ABNORMAL HIGH (ref 0.450–4.500)

## 2022-04-25 NOTE — Patient Instructions (Signed)

## 2022-04-27 ENCOUNTER — Encounter: Payer: Self-pay | Admitting: Nurse Practitioner

## 2022-04-27 ENCOUNTER — Ambulatory Visit (INDEPENDENT_AMBULATORY_CARE_PROVIDER_SITE_OTHER): Payer: BC Managed Care – PPO | Admitting: Nurse Practitioner

## 2022-04-27 VITALS — BP 138/84 | HR 85 | Ht 66.0 in | Wt 236.2 lb

## 2022-04-27 DIAGNOSIS — E890 Postprocedural hypothyroidism: Secondary | ICD-10-CM

## 2022-04-27 MED ORDER — LEVOTHYROXINE SODIUM 112 MCG PO TABS: 112.0000 ug | ORAL_TABLET | Freq: Every day | ORAL | 1 refills | Status: DC

## 2022-04-27 NOTE — Progress Notes (Signed)
04/27/2022     Endocrinology Follow Up Note    Subjective:    Patient ID: Kari Huber, female    DOB: 02/22/1975, PCP Royann Shivers, PA-C.   Past Medical History:  Diagnosis Date   GERD (gastroesophageal reflux disease)    Heartburn 06/23/2010   Hypertension 06/23/2010    Past Surgical History:  Procedure Laterality Date   TUBAL LIGATION  2005    Social History   Socioeconomic History   Marital status: Married    Spouse name: Not on file   Number of children: Not on file   Years of education: Not on file   Highest education level: Not on file  Occupational History   Not on file  Tobacco Use   Smoking status: Every Day    Packs/day: 1    Types: Cigarettes   Smokeless tobacco: Not on file  Vaping Use   Vaping Use: Never used  Substance and Sexual Activity   Alcohol use: Never   Drug use: Yes    Types: Marijuana   Sexual activity: Not on file  Other Topics Concern   Not on file  Social History Narrative   Not on file   Social Determinants of Health   Financial Resource Strain: Not on file  Food Insecurity: Not on file  Transportation Needs: Not on file  Physical Activity: Not on file  Stress: Not on file  Social Connections: Not on file    Family History  Problem Relation Age of Onset   Thyroid disease Neg Hx     Outpatient Encounter Medications as of 04/27/2022  Medication Sig   amLODipine (NORVASC) 5 MG tablet Take 5 mg by mouth daily.   metFORMIN (GLUCOPHAGE-XR) 500 MG 24 hr tablet Take 500 mg by mouth 2 (two) times daily with a meal.   [DISCONTINUED] levothyroxine (SYNTHROID) 88 MCG tablet Take 1 tablet (88 mcg total) by mouth daily before breakfast.   levothyroxine (SYNTHROID) 112 MCG tablet Take 1 tablet (112 mcg total) by mouth daily before breakfast.   No facility-administered encounter medications on file as of 04/27/2022.    ALLERGIES: Allergies  Allergen Reactions   Meperidine Swelling    Made her head feel  like it was on fire Made her head feel like it was on fire     VACCINATION STATUS:  There is no immunization history on file for this patient.   HPI  Kari Huber is 47 y.o. female who presents today with a medical history as above. she is being seen in follow up after being seen in consultation for hyperthyroidism requested by Royann Shivers, PA-C.  she has been dealing with symptoms of  right neck mass, fatigue, heat intolerance, and headache intermittently for approximately 2 years. These symptoms are progressively worsening and troubling to her.  her most recent thyroid labs revealed suppressed tSH of < 0.005 on 03/23/21.  She has been on Methimazole in the past and followed with [facility] but was stopped just over a year ago.  The has never had any imaging of her thyroid in the past.  she denies dysphagia, choking, shortness of breath, no recent voice change.    she denies family history of thyroid dysfunction- says mom had thyroidectomy due to esophageal cancer, and denies family hx of thyroid cancer. she denies personal history of goiter. she is not on any anti-thyroid medications nor on any thyroid hormone supplements. Denies use of Biotin containing supplements.  she is willing to proceed  with appropriate work up and therapy for thyrotoxicosis.  She does smoke marijuana which can mask some of the symptoms of hyperthyroidism.  She is s/p RAI ablation on 05/26/21.  Review of systems  Constitutional: + Minimally fluctuating body weight, current Body mass index is 38.12 kg/m., + fatigue-improving, no subjective hyperthermia, no subjective hypothermia Eyes: no blurry vision, no xerophthalmia ENT: no sore throat, + nodules palpated in throat- right side, no dysphagia/odynophagia, no hoarseness Cardiovascular: no chest pain, no shortness of breath, no palpitations, no leg swelling Respiratory: no cough, no shortness of breath Gastrointestinal: no  nausea/vomiting/diarrhea Musculoskeletal: no muscle/joint aches Skin: no rashes, no hyperemia Neurological: no tremors, no numbness, no tingling, no dizziness Psychiatric: no depression, no anxiety   Objective:    BP 138/84 (BP Location: Left Arm, Patient Position: Sitting, Cuff Size: Large)   Pulse 85   Ht  (1.676 m)   Wt 236 lb 3.2 oz (107.1 kg)   BMI 38.12 kg/m   Wt Readings from Last 3 Encounters:  04/27/22 236 lb 3.2 oz (107.1 kg)  07/14/21 236 lb (107 kg)  05/05/21 238 lb (108 kg)     BP Readings from Last 3 Encounters:  04/27/22 138/84  07/14/21 138/83  05/05/21 (!) 141/88                          Physical Exam- Limited  Constitutional:  Body mass index is 38.12 kg/m. , not in acute distress, normal state of mind Eyes:  EOMI, no exophthalmos Musculoskeletal: no gross deformities, strength intact in all four extremities, no gross restriction of joint movements Skin:  no rashes, no hyperemia Neurological: no tremor with outstretched hands   CMP  No results found for: "NA", "K", "CL", "CO2", "GLUCOSE", "BUN", "CREATININE", "CALCIUM", "PROT", "ALBUMIN", "AST", "ALT", "ALKPHOS", "BILITOT", "GFRNONAA", "GFRAA"   CBC No results found for: "WBC", "RBC", "HGB", "HCT", "PLT", "MCV", "MCH", "MCHC", "RDW", "LYMPHSABS", "MONOABS", "EOSABS", "BASOSABS"   Diabetic Labs (most recent): No results found for: "HGBA1C", "MICROALBUR"  Lipid Panel  No results found for: "CHOL", "TRIG", "HDL", "CHOLHDL", "VLDL", "LDLCALC", "LDLDIRECT", "LABVLDL"   Lab Results  Component Value Date   TSH 21.100 (H) 04/24/2022   TSH 53.200 (H) 01/02/2022   TSH 59.800 (H) 10/24/2021   TSH <0.005 (L) 07/07/2021   TSH <0.005 (L) 05/05/2021   TSH 0.01 (A) 03/23/2021   TSH <0.01 Repeated and verified X2. (L) 02/15/2020   FREET4 1.00 04/24/2022   FREET4 0.67 (L) 01/02/2022   FREET4 0.10 (L) 10/24/2021   FREET4 2.62 (H) 07/07/2021   FREET4 2.45 (H) 05/05/2021   FREET4 0.50 (L)  02/15/2020    Thyroid US from 05/18/21 CLINICAL DATA:  Hyperthyroid.   EXAM: THYROID ULTRASOUND   TECHNIQUE: Ultrasound examination of the thyroid gland and adjacent soft tissues was performed.   COMPARISON:  None Available.   FINDINGS: Parenchymal Echotexture: Moderately heterogeneous, no significantly increased vascularity   Isthmus: 0.4 cm   Right lobe: 6.0 x 2.0 x 2.4 cm   Left lobe: 5.5 x 1.7 x 2.8 cm   ________________________________________________________   Estimated total number of nodules >/= 1 cm: 1   Number of spongiform nodules >/=  2 cm not described below (TR1): 0   Number of mixed cystic and solid nodules >/= 1.5 cm not described below (TR2): 0   _________________________________________________________   Nodule # 1:   Location: Isthmus; Inferior   Maximum size: 0.8 cm; Other 2 dimensions: 0.4 x 0.8  cm   Composition: solid/almost completely solid (2)   Echogenicity: hypoechoic (2)   Shape: not taller-than-wide (0)   Margins: smooth (0)   Echogenic foci: none (0)   ACR TI-RADS total points: 4.   ACR TI-RADS risk category: TR4 (4-6 points).   ACR TI-RADS recommendations:   Given size (<0.9 cm) and appearance, this nodule does NOT meet TI-RADS criteria for biopsy or dedicated follow-up.   _________________________________________________________   Nodule # 2:   Location: Left; Mid   Maximum size: 1.1 cm; Other 2 dimensions: 0.8 x 0.4 cm   Composition: solid/almost completely solid (2)   Echogenicity: hypoechoic (2)   Shape: not taller-than-wide (0)   Margins: ill-defined (0)   Echogenic foci: none (0)   ACR TI-RADS total points: 4.   ACR TI-RADS risk category: TR4 (4-6 points).   ACR TI-RADS recommendations:   *Given size (>/= 1 - 1.4 cm) and appearance, a follow-up ultrasound in 1 year should be considered based on TI-RADS criteria.   _________________________________________________________   No cervical  lymphadenopathy.   IMPRESSION: 1. Mildly enlarged and moderately heterogeneous thyroid gland. These are nonspecific findings but could be seen with thyroiditis or autoimmune thyroid disorder. 2. Solid nodule in the left inferior thyroid (labeled 2, 1.1 cm) meets criteria (TI-RADS category 4) for 1 year ultrasound surveillance.   The above is in keeping with the ACR TI-RADS recommendations - J Am Coll Radiol 2017;14:587-595.   Marliss Coots, MD   [facility]   [facility]     Electronically Signed   By: Marliss Coots M.D.   On: 05/18/2021 10:16 --------------------------------------------------------------------------------------------------------------------- Uptake and Scan from 05/19/21 CLINICAL DATA:  Hyperthyroidism, heat intolerance, neck swelling, difficulty swallowing, diarrhea, sore throat, fatigue, increased perspiration, suppressed TSH   EXAM: THYROID SCAN AND UPTAKE - 4 AND 24 HOURS   TECHNIQUE: Following oral administration of I-123 capsule, anterior planar imaging was acquired at 24 hours. Thyroid uptake was calculated with a thyroid probe at 4-6 hours and 24 hours.   RADIOPHARMACEUTICALS:  457 uCi I-123 sodium iodide p.o.   COMPARISON:  None Available.   FINDINGS: Homogeneous tracer distribution in both thyroid lobes.   No focal areas of increased or decreased tracer localization.   4 hour I-123 uptake = 35.6% (normal 5-20%)   24 hour I-123 uptake = 46.8% (normal 10-30%)   IMPRESSION: Homogeneous thyroid scan with elevated 4 hour and 24 hour radio iodine uptakes.   Findings consistent with Graves disease.     Electronically Signed   By: Ulyses Southward M.D.   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  Her 4 and 24-hr uptake was high, consistent with Graves disease.  I discussed and ordered RAI ablation which was done on 05/26/21.   Her repeat thyroid function tests are consistent with under-replacement.  She is advised to increase her Levothyroxine to 112 mcg po daily before breakfast.   - The correct intake of thyroid hormone (Levothyroxine, Synthroid),  is on empty stomach first thing in the morning, with water, separated by at least 30 minutes from breakfast and other medications,  and separated by more than 4 hours from calcium, iron, multivitamins, acid reflux medications (PPIs).  - This medication is a life-long medication and will be needed to correct thyroid hormone imbalances for the rest of your life.  The dose may change from time to time, based on thyroid blood work.  - It is extremely important to be consistent taking this medication, near the same time each morning.  -AVOID TAKING PRODUCTS CONTAINING BIOTIN (commonly found in Hair, Skin, Nails vitamins) AS IT INTERFERES WITH THE VALIDITY OF THYROID FUNCTION BLOOD TESTS.    -Patient is advised to maintain close follow up with Royann Shivers, PA-C for primary care needs.     I spent  15  minutes in the care of the patient today including review of labs from Thyroid Function, CMP, and other relevant labs ; imaging/biopsy records (current and previous including abstractions from other facilities); face-to-face time discussing  her lab results and symptoms, medications doses, her options of short and long term treatment based on the latest standards of care / guidelines;   and documenting the encounter.  Kari Huber  participated in the  discussions, expressed understanding, and voiced agreement with the above plans.  All questions were answered to her satisfaction. she is encouraged to contact clinic should she have any questions or concerns prior to her return visit.   Follow up plan: Return in about 3 months (around 07/27/2022) for Thyroid follow up, Previsit labs.   Thank you for involving me in the care of this pleasant patient, and I will continue to update you with her progress.   Ronny Bacon, [facility] [address] [place], [place] [address] Phone: [phone] Fax: [phone]  04/27/2022, 11:22 AM

## 2022-08-01 LAB — T4, FREE: Free T4: 1.36 ng/dL (ref 0.82–1.77)

## 2022-08-01 LAB — TSH: TSH: 14.7 u[IU]/mL — ABNORMAL HIGH (ref 0.450–4.500)

## 2022-08-06 ENCOUNTER — Ambulatory Visit: Payer: BC Managed Care – PPO | Admitting: Nurse Practitioner

## 2022-08-06 DIAGNOSIS — E890 Postprocedural hypothyroidism: Secondary | ICD-10-CM

## 2022-10-21 ENCOUNTER — Other Ambulatory Visit: Payer: Self-pay | Admitting: Nurse Practitioner

## 2022-10-21 DIAGNOSIS — E890 Postprocedural hypothyroidism: Secondary | ICD-10-CM

## 2023-01-20 ENCOUNTER — Other Ambulatory Visit: Payer: Self-pay | Admitting: Nurse Practitioner

## 2023-01-20 DIAGNOSIS — E890 Postprocedural hypothyroidism: Secondary | ICD-10-CM

## 2023-01-24 ENCOUNTER — Other Ambulatory Visit: Payer: Self-pay | Admitting: Nurse Practitioner

## 2023-01-24 DIAGNOSIS — E890 Postprocedural hypothyroidism: Secondary | ICD-10-CM

## 2023-01-24 MED ORDER — LEVOTHYROXINE SODIUM 112 MCG PO TABS
112.0000 ug | ORAL_TABLET | Freq: Every day | ORAL | 0 refills | Status: DC
Start: 2023-01-24 — End: 2023-03-19

## 2023-03-13 LAB — T4, FREE: Free T4: 1.5 ng/dL (ref 0.82–1.77)

## 2023-03-13 LAB — TSH: TSH: 4.57 u[IU]/mL — ABNORMAL HIGH (ref 0.450–4.500)

## 2023-03-18 NOTE — Patient Instructions (Signed)

## 2023-03-19 ENCOUNTER — Encounter: Payer: Self-pay | Admitting: Nurse Practitioner

## 2023-03-19 ENCOUNTER — Ambulatory Visit: Payer: BC Managed Care – PPO | Admitting: Nurse Practitioner

## 2023-03-19 VITALS — BP 138/86 | HR 72 | Ht 66.0 in | Wt 238.4 lb

## 2023-03-19 DIAGNOSIS — E890 Postprocedural hypothyroidism: Secondary | ICD-10-CM | POA: Diagnosis not present

## 2023-03-19 MED ORDER — LEVOTHYROXINE SODIUM 112 MCG PO TABS: 112.0000 ug | ORAL_TABLET | Freq: Every day | ORAL | 1 refills | Status: AC

## 2023-03-19 NOTE — Progress Notes (Signed)
 03/19/2023     Endocrinology Follow Up Note    Subjective:    Patient ID: Kari Huber, female    DOB: 05-28-1975, PCP Royann Shivers, PA-C.   Past Medical History:  Diagnosis Date   GERD (gastroesophageal reflux disease)    Heartburn 06/23/2010   Hypertension 06/23/2010    Past Surgical History:  Procedure Laterality Date   TUBAL LIGATION  2005    Social History   Socioeconomic History   Marital status: Married    Spouse name: Not on file   Number of children: Not on file   Years of education: Not on file   Highest education level: Not on file  Occupational History   Not on file  Tobacco Use   Smoking status: Every Day    Current packs/day: 1.00    Types: Cigarettes   Smokeless tobacco: Not on file  Vaping Use   Vaping status: Never Used  Substance and Sexual Activity   Alcohol use: Never   Drug use: Yes    Types: Marijuana   Sexual activity: Not on file  Other Topics Concern   Not on file  Social History Narrative   Not on file   Social Drivers of Health   Financial Resource Strain: Not on file  Food Insecurity: Not on file  Transportation Needs: Not on file  Physical Activity: Not on file  Stress: Not on file  Social Connections: Not on file    Family History  Problem Relation Age of Onset   Thyroid disease Neg Hx     Outpatient Encounter Medications as of 03/19/2023  Medication Sig   amLODipine (NORVASC) 5 MG tablet Take 5 mg by mouth daily.   metFORMIN (GLUCOPHAGE-XR) 500 MG 24 hr tablet Take 500 mg by mouth 2 (two) times daily with a meal.   [DISCONTINUED] levothyroxine (SYNTHROID) 112 MCG tablet Take 1 tablet (112 mcg total) by mouth daily before breakfast.   levothyroxine (SYNTHROID) 112 MCG tablet Take 1 tablet (112 mcg total) by mouth daily before breakfast.   No facility-administered encounter medications on file as of 03/19/2023.    ALLERGIES: Allergies  Allergen Reactions   Meperidine Swelling    Made her head  feel like it was on fire Made her head feel like it was on fire     VACCINATION STATUS:  There is no immunization history on file for this patient.   HPI  Kari Huber is 48 y.o. female who presents today with a medical history as above. she is being seen in follow up after being seen in consultation for hyperthyroidism requested by Royann Shivers, PA-C.  she has been dealing with symptoms of  right neck mass, fatigue, heat intolerance, and headache intermittently for approximately 2 years. These symptoms are progressively worsening and troubling to her.  her most recent thyroid labs revealed suppressed tSH of < 0.005 on 03/23/21.  She has been on Methimazole in the past and followed with [facility] but was stopped just over a year ago.  The has never had any imaging of her thyroid in the past.  she denies dysphagia, choking, shortness of breath, no recent voice change.    she denies family history of thyroid dysfunction- says mom had thyroidectomy due to esophageal cancer, and denies family hx of thyroid cancer. she denies personal history of goiter. she is not on any anti-thyroid medications nor on any thyroid hormone supplements. Denies use of Biotin containing supplements.  she is willing to  proceed with appropriate work up and therapy for thyrotoxicosis.  She does smoke marijuana which can mask some of the symptoms of hyperthyroidism.  She is s/p RAI ablation on 05/26/21.  Review of systems  Constitutional: + Minimally fluctuating body weight, current Body mass index is 38.48 kg/m., + fatigue-improving, no subjective hyperthermia, no subjective hypothermia Eyes: no blurry vision, no xerophthalmia ENT: no sore throat, + nodules palpated in throat- right side, no dysphagia/odynophagia, no hoarseness Cardiovascular: no chest pain, no shortness of breath, no palpitations, no leg swelling Respiratory: no cough, no shortness of breath Gastrointestinal: no  nausea/vomiting/diarrhea Musculoskeletal: no muscle/joint aches Skin: no rashes, no hyperemia Neurological: no tremors, no numbness, no tingling, no dizziness Psychiatric: no depression, no anxiety   Objective:    BP 138/86 (BP Location: Left Arm, Patient Position: Sitting, Cuff Size: Large)   Pulse 72   Ht 5\' 6"  (1.676 m)   Wt 238 lb 6.4 oz (108.1 kg)   BMI 38.48 kg/m   Wt Readings from Last 3 Encounters:  03/19/23 238 lb 6.4 oz (108.1 kg)  04/27/22 236 lb 3.2 oz (107.1 kg)  07/14/21 236 lb (107 kg)     BP Readings from Last 3 Encounters:  03/19/23 138/86  04/27/22 138/84  07/14/21 138/83     Physical Exam- Limited  Constitutional:  Body mass index is 38.48 kg/m. , not in acute distress, normal state of mind Eyes:  EOMI, no exophthalmos Musculoskeletal: no gross deformities, strength intact in all four extremities, no gross restriction of joint movements Skin:  no rashes, no hyperemia Neurological: no tremor with outstretched hands   CMP  No results found for: "NA", "K", "CL", "CO2", "GLUCOSE", "BUN", "CREATININE", "CALCIUM", "PROT", "ALBUMIN", "AST", "ALT", "ALKPHOS", "BILITOT", "GFRNONAA", "GFRAA"   CBC No results found for: "WBC", "RBC", "HGB", "HCT", "PLT", "MCV", "MCH", "MCHC", "RDW", "LYMPHSABS", "MONOABS", "EOSABS", "BASOSABS"   Diabetic Labs (most recent): No results found for: "HGBA1C", "MICROALBUR"  Lipid Panel  No results found for: "CHOL", "TRIG", "HDL", "CHOLHDL", "VLDL", "LDLCALC", "LDLDIRECT", "LABVLDL"   Lab Results  Component Value Date   TSH 4.570 (H) 03/12/2023   TSH 14.700 (H) 07/31/2022   TSH 21.100 (H) 04/24/2022   TSH 53.200 (H) 01/02/2022   TSH 59.800 (H) 10/24/2021   TSH <0.005 (L) 07/07/2021   TSH <0.005 (L) 05/05/2021   TSH 0.01 (A) 03/23/2021   TSH <0.01 Repeated and verified X2. (L) 02/15/2020   FREET4 1.50 03/12/2023   FREET4 1.36 07/31/2022   FREET4 1.00 04/24/2022   FREET4 0.67 (L) 01/02/2022   FREET4 0.10 (L)  10/24/2021   FREET4 2.62 (H) 07/07/2021   FREET4 2.45 (H) 05/05/2021   FREET4 0.50 (L) 02/15/2020    Thyroid US from 05/18/21 CLINICAL DATA:  Hyperthyroid.   EXAM: THYROID ULTRASOUND   TECHNIQUE: Ultrasound examination of the thyroid gland and adjacent soft tissues was performed.   COMPARISON:  None Available.   FINDINGS: Parenchymal Echotexture: Moderately heterogeneous, no significantly increased vascularity   Isthmus: 0.4 cm   Right lobe: 6.0 x 2.0 x 2.4 cm   Left lobe: 5.5 x 1.7 x 2.8 cm   ________________________________________________________   Estimated total number of nodules >/= 1 cm: 1   Number of spongiform nodules >/=  2 cm not described below (TR1): 0   Number of mixed cystic and solid nodules >/= 1.5 cm not described below (TR2): 0   _________________________________________________________   Nodule # 1:   Location: Isthmus; Inferior   Maximum size: 0.8 cm; Other 2 dimensions:  0.4 x 0.8 cm   Composition: solid/almost completely solid (2)   Echogenicity: hypoechoic (2)   Shape: not taller-than-wide (0)   Margins: smooth (0)   Echogenic foci: none (0)   ACR TI-RADS total points: 4.   ACR TI-RADS risk category: TR4 (4-6 points).   ACR TI-RADS recommendations:   Given size (<0.9 cm) and appearance, this nodule does NOT meet TI-RADS criteria for biopsy or dedicated follow-up.   _________________________________________________________   Nodule # 2:   Location: Left; Mid   Maximum size: 1.1 cm; Other 2 dimensions: 0.8 x 0.4 cm   Composition: solid/almost completely solid (2)   Echogenicity: hypoechoic (2)   Shape: not taller-than-wide (0)   Margins: ill-defined (0)   Echogenic foci: none (0)   ACR TI-RADS total points: 4.   ACR TI-RADS risk category: TR4 (4-6 points).   ACR TI-RADS recommendations:   *Given size (>/= 1 - 1.4 cm) and appearance, a follow-up ultrasound in 1 year should be considered based on TI-RADS  criteria.   _________________________________________________________   No cervical lymphadenopathy.   IMPRESSION: 1. Mildly enlarged and moderately heterogeneous thyroid gland. These are nonspecific findings but could be seen with thyroiditis or autoimmune thyroid disorder. 2. Solid nodule in the left inferior thyroid (labeled 2, 1.1 cm) meets criteria (TI-RADS category 4) for 1 year ultrasound surveillance.   The above is in keeping with the ACR TI-RADS recommendations - J Am Coll Radiol 2017;14:587-595.   Marliss Coots, MD   [facility]   [facility]     Electronically Signed   By: Marliss Coots M.D.   On: 05/18/2021 10:16 --------------------------------------------------------------------------------------------------------------------- Uptake and Scan from 05/19/21 CLINICAL DATA:  Hyperthyroidism, heat intolerance, neck swelling, difficulty swallowing, diarrhea, sore throat, fatigue, increased perspiration, suppressed TSH   EXAM: THYROID SCAN AND UPTAKE - 4 AND 24 HOURS   TECHNIQUE: Following oral administration of I-123 capsule, anterior planar imaging was acquired at 24 hours. Thyroid uptake was calculated with a thyroid probe at 4-6 hours and 24 hours.   RADIOPHARMACEUTICALS:  457 uCi I-123 sodium iodide p.o.   COMPARISON:  None Available.   FINDINGS: Homogeneous tracer distribution in both thyroid lobes.   No focal areas of increased or decreased tracer localization.   4 hour I-123 uptake = 35.6% (normal 5-20%)   24 hour I-123 uptake = 46.8% (normal 10-30%)   IMPRESSION: Homogeneous thyroid scan with elevated 4 hour and 24 hour radio iodine uptakes.   Findings consistent with Graves disease.     Electronically Signed   By: Ulyses Southward M.D.   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  Her 4 and 24-hr uptake was high, consistent with Graves disease.  I discussed and ordered RAI ablation which was done on 05/26/21.   Her repeat thyroid function tests are consistent appropriate hormone replacement (TSH still slightly high but FT4 is on upper end  of normal)  She is advised to continue her Levothyroxine 112 mcg po daily before breakfast.  Will recheck TFTs prior to next visit and adjust dose accordingly.   - The correct intake of thyroid hormone (Levothyroxine, Synthroid), is on empty stomach first thing in the morning, with water, separated by at least 30 minutes from breakfast and other medications,  and separated by more than 4 hours from calcium, iron, multivitamins, acid reflux medications (PPIs).  - This medication is a life-long medication and will be needed to correct thyroid hormone imbalances for the rest of your life.  The dose may change from time to time, based on thyroid blood work.  - It is extremely important to be consistent taking this medication, near the same time each morning.  -AVOID TAKING PRODUCTS CONTAINING BIOTIN (commonly found in Hair, Skin, Nails vitamins) AS IT INTERFERES WITH THE VALIDITY OF THYROID FUNCTION BLOOD TESTS.    -Patient is advised to maintain close follow up with Royann Shivers, PA-C for primary care needs.    I spent  14  minutes in the care of the patient today including review of labs from Thyroid Function, CMP, and other relevant labs ; imaging/biopsy records (current and previous including  abstractions from other facilities); face-to-face time discussing  her lab results and symptoms, medications doses, her options of short and long term treatment based on the latest standards of care / guidelines;   and documenting the encounter.  Kari Huber  participated in the discussions, expressed understanding, and voiced agreement with the above plans.  All questions were answered to her satisfaction. she is encouraged to contact clinic should she have any questions or concerns prior to her return visit.   Follow up plan: Return in about 4 months (around 07/19/2023) for Thyroid follow up, Previsit labs.   Thank you for involving me in the care of this pleasant patient, and I will continue to update you with her progress.   Ronny Bacon, [facility] [address] [place], [place] [address] Phone: [phone] Fax: [phone]  03/19/2023, 8:10 AM

## 2023-07-22 ENCOUNTER — Ambulatory Visit: Admitting: Nurse Practitioner

## 2024-04-11 IMAGING — NM NM THYROID IMAGING W/ UPTAKE MULTI (4&24 HR)
4 series · 4 of 4 positions shown · non-contrast
Comparison: None Available.

CLINICAL DATA: Hyperthyroidism, heat intolerance, neck swelling,
difficulty swallowing, diarrhea, sore throat, fatigue, increased
perspiration, suppressed TSH

EXAM:
THYROID SCAN AND UPTAKE - 4 AND 24 HOURS
TECHNIQUE: Following oral administration of 8-BC7 capsule, anterior planar
imaging was acquired at 24 hours. Thyroid uptake was calculated with
a thyroid probe at 4-6 hours and 24 hours.
RADIOPHARMACEUTICALS:  457 uCi 8-BC7 sodium iodide p.o.

[Series 1: anterior · 1.18mm/px · 1 of 1 slices shown]
[im 1/1  full-range]
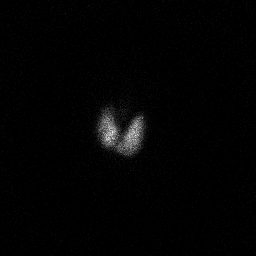

[Series 2: ant w marker · 1.18mm/px · 1 of 1 slices shown]
[im 1/1  full-range]
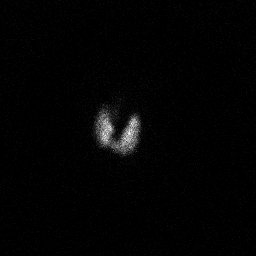

[Series 3: lao · 1.18mm/px · 1 of 1 slices shown]
[im 1/1  full-range]
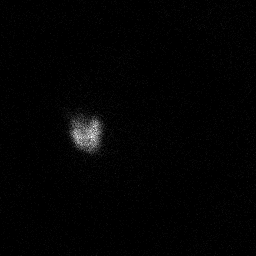

[Series 4: rao · 1.18mm/px · 1 of 1 slices shown]
[im 1/1  full-range]
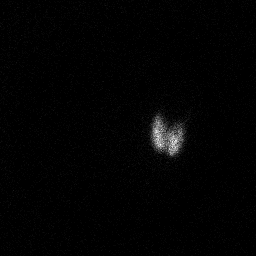

[4 of 4 positions shown; findings below may reference images not displayed]

FINDINGS: Homogeneous tracer distribution in both thyroid lobes.

No focal areas of increased or decreased tracer localization.

4 hour 8-BC7 uptake = 35.6% (normal 5-20%)

24 hour 8-BC7 uptake = 46.8% (normal 10-30%)
IMPRESSION: Homogeneous thyroid scan with elevated 4 hour and 24 hour radio
iodine uptakes.

Findings consistent with Graves disease.
